# Patient Record
Sex: Male | Born: 1985 | Race: Black or African American | Hispanic: No | Marital: Single | Smoking: Never smoker
Health system: Southern US, Community
[De-identification: ages and names within clinical notes are randomized; demographics above are authoritative.]

## PROBLEM LIST (undated history)

## (undated) HISTORY — PX: CRANIOTOMY: SHX93

---

## 2020-05-18 ENCOUNTER — Emergency Department (HOSPITAL_COMMUNITY): Payer: Self-pay

## 2020-05-18 ENCOUNTER — Encounter (HOSPITAL_COMMUNITY): Payer: Self-pay | Admitting: *Deleted

## 2020-05-18 ENCOUNTER — Other Ambulatory Visit: Payer: Self-pay

## 2020-05-18 ENCOUNTER — Emergency Department (HOSPITAL_COMMUNITY)
Admission: EM | Admit: 2020-05-18 | Discharge: 2020-05-18 | Disposition: A | Discharge status: Home / Self Care | Payer: Self-pay | Attending: Emergency Medicine | Admitting: Emergency Medicine

## 2020-05-18 DIAGNOSIS — R0789 Other chest pain: Secondary | ICD-10-CM

## 2020-05-18 DIAGNOSIS — Z20822 Contact with and (suspected) exposure to covid-19: Secondary | ICD-10-CM | POA: Insufficient documentation

## 2020-05-18 DIAGNOSIS — F1490 Cocaine use, unspecified, uncomplicated: Secondary | ICD-10-CM

## 2020-05-18 DIAGNOSIS — R443 Hallucinations, unspecified: Secondary | ICD-10-CM

## 2020-05-18 LAB — COMPREHENSIVE METABOLIC PANEL
ALT: 82 U/L — ABNORMAL HIGH (ref 0–44)
AST: 41 U/L (ref 15–41)
Albumin: 4 g/dL (ref 3.5–5.0)
Alkaline Phosphatase: 63 U/L (ref 38–126)
Anion gap: 8 (ref 5–15)
BUN: 10 mg/dL (ref 6–20)
CO2: 29 mmol/L (ref 22–32)
Calcium: 9.1 mg/dL (ref 8.9–10.3)
Chloride: 104 mmol/L (ref 98–111)
Creatinine, Ser: 0.88 mg/dL (ref 0.61–1.24)
GFR, Estimated: >60 mL/min (ref >60)
Glucose, Bld: 86 mg/dL (ref 70–99)
Potassium: 3.9 mmol/L (ref 3.5–5.1)
Sodium: 141 mmol/L (ref 135–145)
Total Bilirubin: 0.4 mg/dL (ref 0.3–1.2)
Total Protein: 7.1 g/dL (ref 6.5–8.1)

## 2020-05-18 LAB — RAPID URINE DRUG SCREEN, HOSP PERFORMED
Amphetamines: NOT DETECTED
Barbiturates: NOT DETECTED
Benzodiazepines: NOT DETECTED
Cocaine: POSITIVE — AB
Opiates: NOT DETECTED
Tetrahydrocannabinol: POSITIVE — AB

## 2020-05-18 LAB — CBC
HCT: 38.9 % — ABNORMAL LOW (ref 39.0–52.0)
Hemoglobin: 12.6 g/dL — ABNORMAL LOW (ref 13.0–17.0)
MCH: 28.6 pg (ref 26.0–34.0)
MCHC: 32.4 g/dL (ref 30.0–36.0)
MCV: 88.4 fL (ref 80.0–100.0)
Platelets: 237 10*3/uL (ref 150–400)
RBC: 4.4 MIL/uL (ref 4.22–5.81)
RDW: 14.6 % (ref 11.5–15.5)
WBC: 6.6 10*3/uL (ref 4.0–10.5)
nRBC: 0 % (ref 0.0–0.2)

## 2020-05-18 LAB — RESP PANEL BY RT-PCR (FLU A&B, COVID) ARPGX2
Influenza A by PCR: NEGATIVE
Influenza B by PCR: NEGATIVE
SARS Coronavirus 2 by RT PCR: NEGATIVE

## 2020-05-18 LAB — ETHANOL: Alcohol, Ethyl (B): <10 mg/dL (ref <10)

## 2020-05-18 LAB — TROPONIN I (HIGH SENSITIVITY)
Troponin I (High Sensitivity): 4 ng/L (ref <18)
Troponin I (High Sensitivity): 3 ng/L (ref <18)

## 2020-05-18 LAB — SALICYLATE LEVEL: Salicylate Lvl: <7 mg/dL — ABNORMAL LOW (ref 7.0–30.0)

## 2020-05-18 LAB — ACETAMINOPHEN LEVEL: Acetaminophen (Tylenol), Serum: <10 ug/mL — ABNORMAL LOW (ref 10–30)

## 2020-05-18 NOTE — ED Provider Notes (Signed)
Travis Ellison   CSN: [id number] Arrival date & time: 05/18/20  0029     History Chief Complaint  Patient presents with  . Chest Pain  . Hallucinations    Travis Ellison is a 35 y.o. male who reports no past medical history, no old records available for review who presents today for evaluation of 2 complaints. He states that yesterday he started hearing voices when he was at work.  He feels like there are people out to get him.  He denies visual hallucinations.  He states that the voices are telling him to kill himself.  He denies homicidal ideation.  He states that he has no psychiatric history.  He states he has previously been seen at outside hospital and [place] however cannot tell me what for or where it was so I am unable to review any outside records.    He reports that he was walking home from his uncle's house tonight when he started having a sharp pain in the middle of his chest with shortness of breath.  It does not radiate or move.  He denies any history of similar.  He denies any possible causative events for his hallucinations, feeling like people are out to get him or his chest pain.  He reports that he has 1 or 2 drinks a couple times a week.  He denies any drug use specifically including marijuana or cocaine.  He denies alcohol use,. He denies any recent sickness.  He states he is vaccinated against COVID.  He denies having prior COVID infection.  He states that he has no prior medical or psychiatric diagnoses.  When I ask him about the scar on his head he says "that was a long time ago, I fell."  HPI     History reviewed. No pertinent past medical history.  There are no problems to display for this patient.   History reviewed. No pertinent surgical history.     No family history on file.     Home Medications Prior to Admission medications   Not on File    Allergies    Patient has no known allergies.  Review  of Systems   Review of Systems  Constitutional: Negative for chills and fever.  HENT: Negative for congestion.   Eyes: Negative for visual disturbance.  Respiratory: Positive for shortness of breath.   Cardiovascular: Positive for chest pain. Negative for palpitations and leg swelling.  Gastrointestinal: Negative for abdominal pain, diarrhea, nausea and vomiting.  Musculoskeletal: Negative for back pain.  Skin: Negative for color change and wound.  Neurological: Negative for weakness and headaches.  Psychiatric/Behavioral: Positive for behavioral problems and hallucinations.  All other systems reviewed and are negative.   Physical Exam Updated Vital Signs BP 100/65 (BP Location: Right Arm)   Pulse 72   Temp 98.3 F (36.8 C) (Oral)   Resp 18   SpO2 97%   Physical Exam Vitals and nursing Ellison reviewed.  Constitutional:      General: He is not in acute distress.    Appearance: He is not diaphoretic.  HENT:     Head: Normocephalic and atraumatic.     Comments: Large well healed scar across top of head Eyes:     General: No scleral icterus.       Right eye: No discharge.        Left eye: No discharge.     Conjunctiva/sclera: Conjunctivae normal.  Cardiovascular:     Rate  and Rhythm: Normal rate and regular rhythm.     Heart sounds: Normal heart sounds. No murmur heard.   Pulmonary:     Effort: Pulmonary effort is normal. No accessory muscle usage or respiratory distress.     Breath sounds: Normal breath sounds. No stridor. No decreased breath sounds.  Chest:     Chest wall: Tenderness (Palpation over anterior chest recreates and exacerbates his reported pain. ) present.  Abdominal:     General: There is no distension.  Musculoskeletal:        General: No deformity.     Cervical back: Normal range of motion and neck supple.     Right lower leg: No tenderness. No edema.     Left lower leg: No tenderness. No edema.  Skin:    General: Skin is warm and dry.   Neurological:     Mental Status: He is alert.     Motor: No abnormal muscle tone.  Psychiatric:        Behavior: Behavior normal.     ED Results / Procedures / Treatments   Labs (all labs ordered are listed, but only abnormal results are displayed) Labs Reviewed  COMPREHENSIVE METABOLIC PANEL - Abnormal; Notable for the following components:      Result Value   ALT 82 (*)    All other components within normal limits  CBC - Abnormal; Notable for the following components:   Hemoglobin 12.6 (*)    HCT 38.9 (*)    All other components within normal limits  RAPID URINE DRUG SCREEN, HOSP PERFORMED - Abnormal; Notable for the following components:   Cocaine POSITIVE (*)    Tetrahydrocannabinol POSITIVE (*)    All other components within normal limits  ACETAMINOPHEN LEVEL - Abnormal; Notable for the following components:   Acetaminophen (Tylenol), Serum <10 (*)    All other components within normal limits  SALICYLATE LEVEL - Abnormal; Notable for the following components:   Salicylate Lvl <7.0 (*)    All other components within normal limits  RESP PANEL BY RT-PCR (FLU A&B, COVID) ARPGX2  ETHANOL  TROPONIN I (HIGH SENSITIVITY)  TROPONIN I (HIGH SENSITIVITY)    EKG None  Radiology DG Chest 2 View  Result Date: 05/18/2020 CLINICAL DATA:  Chest pain, shortness of breath EXAM: CHEST - 2 VIEW COMPARISON:  None. FINDINGS: Fine reticular opacities are present throughout both lungs. Pulmonary vascularity remains normally distributed. No pneumothorax or visible effusion. The cardiomediastinal contours are unremarkable. No acute osseous or soft tissue abnormality. IMPRESSION: Fine reticular opacities throughout both lungs, could reflect mild edema or infection/inflammation the appropriate setting. Electronically Signed   By: Kreg Shropshire M.D.   On: 05/18/2020 01:07   CT Head Wo Contrast  Result Date: 05/18/2020 CLINICAL DATA:  Mental status change with unknown cause. EXAM: CT HEAD  WITHOUT CONTRAST TECHNIQUE: Contiguous axial images were obtained from the base of the skull through the vertex without intravenous contrast. COMPARISON:  None. FINDINGS: Brain: No evidence of acute infarction, hemorrhage, hydrocephalus, extra-axial collection or mass lesion/mass effect. Vascular: No hyperdense vessel or unexpected calcification. Skull: Remote high right craniotomy for uncertain indication. Sinuses/Orbits: Mild patchy opacification of frontal and ethmoid sinuses, incidental to the history. IMPRESSION: Negative head CT. Electronically Signed   By: Marnee Spring M.D.   On: 05/18/2020 06:03    Procedures Procedures (including critical care time)  Medications Ordered in ED Medications - No data to display  ED Course  I have reviewed the triage vital signs and  the nursing notes.  Pertinent labs & imaging results that were available during my care of the patient were reviewed by me and considered in my medical decision making (see chart for details).    MDM Rules/Calculators/A&P                          Patient is a 35 year old man who reports that since yesterday he has been hearing voices.  He feels like "everyone is out to get me."  He states that the voices tell him to harm himself. He denies drug use, UDS positive for cocaine and THC. His affect is flat and withdrawn.  Chest x-ray shows fine reticular opacities through both lungs which may be infectious or inflammatory. Here patient is afebrile not tachycardic or tachypneic, 97% on room air. His chest pain is recreated with palpation over the anterior chest. EKG without evidence of ischemia. Doubt ACS. Patient denies any drug use however his UDS is positive for both cocaine and THC. Suspect musculoskeletal chest pain. Opponent x2 is not elevated, doubt ACS. COVID test is ordered.  Given that patient reports these hallucinations are new suddenly started yesterday that he has never had before addition of CT head was obtained  which shows prior craniotomy however no acute cause of patient's symptoms.  On my exam patient is very withdrawn, he pulls the blanket over his head, only takes it off if directly asked to do so than for short period time. During that time he does not make eye contact. He answers questions with 1-2 words whenever possible. He reports that the voices that he is hearing tell him to hurt himself and reports feelings of everyone being out to get him however denies specific instigating event. He states he does not have a history of psychiatric or medical diagnoses and is not on any medicine for this at baseline.  Unable to view outside records at this time to obtain any prior records or corroborating information.  Acetaminophen and salicylate levels are undetected.  At this time COVID test is pending however otherwise patient is medically clear for psychiatric evaluation and disposition.  Ellison: Portions of this report may have been transcribed using voice recognition software. Every effort was made to ensure accuracy; however, inadvertent computerized transcription errors may be present   Final Clinical Impression(s) / ED Diagnoses Final diagnoses:  Hallucinations  Atypical chest pain  Cocaine use    Rx / DC Orders ED Discharge Orders    None       Cristina Gong, PA-C 05/18/20 2952    Geoffery Lyons, MD 05/18/20 2325

## 2020-05-18 NOTE — BH Assessment (Signed)
Comprehensive Clinical Assessment (CCA) Note  05/18/2020 Travis Ellison [id number]   Disposition: per Dr. Lucianne Muss pt is psych cleared   Pt is a 35 yo male who presents voluntarily to [facility] via [facility]. Pt was accompanied by EMS  reporting symptoms of hallucinations and shortness of breath Pt denies  history of mental health and substance use  was referred for assessment by ED . Pt denies medication. Pt denies current suicidal ideation with no plans of self harm . Denies past attempts. Pt denies symptoms of depression, including anhedonia, isolating, feelings of worthlessness & guilt, tearfulness, changes in sleep & appetite, & increased irritability. Pt denies homicidal ideation/ history of violence. Pt denies auditory & visual hallucinations or other symptoms of psychosis. Pt did advised that he was hearing voices yesterday of a voice stating" they were out to get him and his wife", but denies hearing voices currently. Pt advised Clinical research associate that "he hear the same voices every year for about an hour and they go away". Pt disclosed that "he thinks its from not sleeping enough". Pt states current stressors include wife and child.   Pt lives sister and supports include family. Pt denies a hx of abuse and trauma. Pt denies there is a family history of mental health or substance abuse . Pt's is unemployed. Pt has good insight and judgment. Pt's memory is intact . Pt did not report any legal history.  Protective factors against suicide include good family support, no current suicidal ideation, future orientation, therapeutic relationship, no access to firearms, no current psychotic symptoms and no prior attempts.  Pt's denies OP / IP history.  Pt denies alcohol/ substance abuse.  MSE: Pt is dressed in hospital scrubs, alert, oriented x5 with soft  speech and normal motor behavior. Eye contact is good. Pt's mood is euthymic and affect is normal . Affect is congruent with mood. Thought process is coherent and relevant.  There is no indication Pt is currently responding to internal stimuli or experiencing delusional thought content. Pt was cooperative throughout assessment.  Collateral: pt refused to provide collateral information for sister or wife during interview.   Disposition:per Dr. Lucianne Muss pt is psych cleared    Chief Complaint:  Chief Complaint  Patient presents with  . Chest Pain  . Hallucinations   Visit Diagnosis:    CCA Screening, Triage and Referral (STR)  Patient Reported Information How did you hear about [place]? Self  Referral name: No data recorded Referral phone number: No data recorded  Whom do you see for routine medical problems? I don't have a doctor  Practice/Facility Name: No data recorded Practice/Facility Phone Number: No data recorded Name of Contact: No data recorded Contact Number: No data recorded Contact Fax Number: No data recorded Prescriber Name: No data recorded Prescriber Address (if known): No data recorded  What Is the Reason for Your Visit/Call Today? chest pain/ hallucinations  How Long Has This Been Causing You Problems? <Week  What Do You Feel Would Help You the Most Today? Medication   Have You Recently Been in Any Inpatient Treatment (Hospital/Detox/Crisis Center/28-Day Program)? No  Name/Location of Program/Hospital:No data recorded How Long Were You There? No data recorded When Were You Discharged? No data recorded  Have You Ever Received Services From [facility] Before? No  Who Do You See at [facility]? No data recorded  Have You Recently Had Any Thoughts About Hurting Yourself? No  Are You Planning to Commit Suicide/Harm Yourself At This time? No   Have you Recently  Had Thoughts About Hurting Someone Karolee Ohslse? No  Explanation: No data recorded  Have You Used Any Alcohol or Drugs in the Past 24 Hours? No  How Long Ago Did You Use Drugs or Alcohol? No data recorded What Did You Use and How Much? No data recorded  Do You Currently  Have a Therapist/Psychiatrist? No  Name of Therapist/Psychiatrist: No data recorded  Have You Been Recently Discharged From Any Office Practice or Programs? No  Explanation of Discharge From Practice/Program: No data recorded    CCA Screening Triage Referral Assessment Type of Contact: Tele-Assessment  Is this Initial or Reassessment? Initial Assessment  Date Telepsych consult ordered in [facility]:  05/18/2020  [facility]   Patient Reported Information Reviewed? Yes  Patient Left Without Being Seen? No data recorded Reason for Not Completing Assessment: No data recorded  Collateral Involvement: No data recorded  Does Patient Have a Court Appointed Legal Guardian? No data recorded Name and Contact of Legal Guardian: No data recorded If Minor and Not Living with Parent(s), Who has Custody? No data recorded Is CPS involved or ever been involved? Never  Is APS involved or ever been involved? Never   Patient Determined To Be At Risk for Harm To Self or Others Based on Review of Patient Reported Information or Presenting Complaint? No  Method: No data recorded Availability of Means: No data recorded Intent: No data recorded Notification Required: No data recorded Additional Information for Danger to Others Potential: No data recorded Additional Comments for Danger to Others Potential: No data recorded Are There Guns or Other Weapons in Your Home? No data recorded Types of Guns/Weapons: No data recorded Are These Weapons Safely Secured?                            No data recorded Who Could Verify You Are Able To Have These Secured: No data recorded Do You Have any Outstanding Charges, Pending Court Dates, Parole/Probation? No data recorded Contacted To Inform of Risk of Harm To Self or Others: No data recorded  [facility]   Does Patient Present under Involuntary Commitment? No  IVC Papers Initial File Date: No data  recorded  [place]   Patient Currently Receiving the Following Services: No data recorded  Determination of Need: Routine (7 days)   Options For Referral: No data recorded    CCA Biopsychosocial Intake/Chief Complaint:  Hallucinations/ shortness of breath  Current Symptoms/Problems: shortness of breath   Patient Reported Schizophrenia/Schizoaffective Diagnosis in Past: No   Strengths: No data recorded Preferences: No data recorded Abilities: No data recorded  Type of Services Patient Feels are Needed: No data recorded  Initial Clinical Notes/Concerns: No data recorded  Mental Health Symptoms Depression:  None   Duration of Depressive symptoms: No data recorded  Mania:  None   Anxiety:   None   Psychosis:  None   Duration of Psychotic symptoms: No data recorded  Trauma:  None   Obsessions:  N/A   Compulsions:  N/A   Inattention:  N/A   Hyperactivity/Impulsivity:  N/A   Oppositional/Defiant Behaviors:  N/A   Emotional Irregularity:  N/A   Other Mood/Personality Symptoms:  No data recorded   Mental Status Exam Appearance and self-care  Stature:  Average   Weight:  Average weight   Clothing:  -- (In hospital scrubs)   Grooming:  Normal   Cosmetic use:  None   Posture/gait:  Other (Comment) (Patient was laying the the bed)   Motor activity:  Not Remarkable   Sensorium  Attention:  Normal   Concentration:  Preoccupied (patient was sleepy)   Orientation:  X5   Recall/memory:  Normal   Affect and Mood  Affect:  Appropriate   Mood:  Euthymic   Relating  Eye contact:  Normal   Facial expression:  Responsive   Attitude toward examiner:  Cooperative   Thought and Language  Speech flow: Clear and Coherent; Soft   Thought content:  Appropriate to Mood and Circumstances   Preoccupation:  None   Hallucinations:  None   Organization:  No data recorded  [employer] of Knowledge:  Good    Intelligence:  Average   Abstraction:  Normal   Judgement:  Good   Reality Testing:  Realistic   Insight:  Good   Decision Making:  Normal   Social Functioning  Social Maturity:  Responsible   Social Judgement:  Normal   Stress  Stressors:  Family conflict; Financial   Coping Ability:  Normal   Skill Deficits:  None   Supports:  Family     Religion: Religion/Spirituality Are You A Religious Person?: No  Leisure/Recreation: Leisure / Recreation Do You Have Hobbies?: No  Exercise/Diet: Exercise/Diet Do You Exercise?: No Have You Gained or Lost A Significant Amount of Weight in the Past Six Months?: No Do You Follow a Special Diet?: No Do You Have Any Trouble Sleeping?: Yes Explanation of Sleeping Difficulties: past few nights only getting 6 hours sleep   CCA Employment/Education Employment/Work Situation: Employment / Work Psychologist, occupational Employment situation: Unemployed Patient's job has been impacted by current illness: No Has patient ever been in the [employer]?: No  Education: Education Is Patient Currently Attending School?: No Did Garment/textile technologist From [employer]?: No Did You Product manager?: No Did Designer, television/film set?: No Did You Have An Individualized Education Program (IIEP): No Did You Have Any Difficulty At [employer]?: No Patient's Education Has Been Impacted by Current Illness: No   CCA Family/Childhood History Family and Relationship History: Family history Does patient have children?: Yes How many children?: 1 How is patient's relationship with their children?: good  Childhood History:  Childhood History By whom was/is the patient raised?: Other (Comment) (Unknown) Does patient have siblings?: Yes Number of Siblings: 1 Description of patient's current relationship with siblings: good Did patient suffer any verbal/emotional/physical/sexual abuse as a child?: No Did patient suffer from severe childhood neglect?: No Has patient ever  been sexually abused/assaulted/raped as an adolescent or adult?: No Was the patient ever a victim of a crime or a disaster?: No Witnessed domestic violence?: No Has patient been affected by domestic violence as an adult?: No  Child/Adolescent Assessment:     CCA Substance Use Alcohol/Drug Use: Alcohol / Drug Use Pain Medications: SEE MAR Prescriptions: SEE MAR Over the Counter: SEE MAR History of alcohol / drug use?: No history of alcohol / drug abuse                         ASAM's:  Six Dimensions of Multidimensional Assessment  Dimension 1:  Acute Intoxication and/or Withdrawal Potential:      Dimension 2:  Biomedical Conditions and Complications:      Dimension 3:  Emotional, Behavioral, or Cognitive Conditions and Complications:     Dimension 4:  Readiness to Change:     Dimension 5:  Relapse, Continued use, or Continued Problem Potential:     Dimension 6:  Recovery/Living Environment:     ASAM Severity Score:    ASAM Recommended Level of Treatment:     Substance use Disorder (SUD)    Recommendations for Services/Supports/Treatments:    DSM5 Diagnoses: There are no problems to display for this patient.   Patient Centered Plan: Patient is on the following Treatment Plan(s):    Referrals to Alternative Service(s): Referred to Alternative Service(s):   Place:   Date:   Time:    Referred to Alternative Service(s):   Place:   Date:   Time:    Referred to Alternative Service(s):   Place:   Date:   Time:    Referred to Alternative Service(s):   Place:   Date:   Time:     Rachel Moulds, [place]

## 2020-05-18 NOTE — ED Notes (Signed)
Lunch Tray Ordered @ 1019. 

## 2020-05-18 NOTE — ED Notes (Signed)
3 patient belonging bags in locker 1 in purple zone

## 2020-05-18 NOTE — ED Triage Notes (Signed)
Pt arrives via [facility] from home with c/o Cp and sob sharp/central around 2300 tonight. Refused asa. 12 lead normal. 130/70, hr 60, rr 16,  100% RA.

## 2020-05-18 NOTE — ED Provider Notes (Signed)
Emergency Medicine Observation Re-evaluation Note  Travis Ellison is a 35 y.o. male, seen on rounds today.  Pt initially presented to the ED for complaints of Chest Pain and Hallucinations Currently, the patient is resting calmly in his hospital bed.  He is pleasant and conversive.  Currently has no complaints.  Physical Exam  BP 100/65 (BP Location: Right Arm)   Pulse 72   Temp 98.3 F (36.8 C) (Oral)   Resp 18   SpO2 97%  Physical Exam General: Well-appearing Cardiac: Normal rate Lungs: In no acute distress, no increased work of breathing. Psych: Does not appear to be interacting with internal stimuli, pleasant mood.  Denies SI/HI at this time.  ED Course / MDM  EKG:    I have reviewed the labs performed to date as well as medications administered while in observation.  Recent changes in the last 24 hours include none.  Plan  Current plan is for discharge at this time, as patient has been formally psych cleared.  Will provide outpatient psychiatric resources. Patient is not under full IVC at this time.   Sherrilee Gilles 05/18/20 1303    Blane Ohara, MD 05/18/20 1615

## 2020-05-18 NOTE — ED Triage Notes (Signed)
Pt states he was walking home from his uncles house tonight and started having a sharp pain in the middle of his chest, also having some shortness of breath. In the middle of asking questions about chest pain, pt says "I am hearing voices". He says that the voices tell him people are "out for me" and the voices are telling him to hurt himself. He denies SI. Denies that he is taking medications for the same. Alert/oriented/cooperative.

## 2020-05-18 NOTE — Discharge Instructions (Addendum)
You may follow up for your mental health needs with a provider of your choosing from the list below.   Substance Abuse Treatment Programs  Intensive Outpatient Programs [facility]     [address]      [place], [place]                   [phone]       [facility] [address] [place] [address] [place], [place] [phone]  [facility]     (Inpatient and outpatient)     [address]           [phone]    [facility] [phone] (Suboxone and Methadone)  [address]      [place], [place] [address]      [phone]       [address] [place], [place] [phone]  [facility] (Outpatient/Inpatient, Chemical)    (insurance only) [phone]             [facility] (Groups & Residential) [place], [place] [phone]     [facility]     [address]     [place], [place]      [phone]       [facility] (for caregivers and family) [phone] [address] Laurell [address] [place], [place] [phone]      Residential Treatment Programs [facility]      [address] [place], [place] [address]  [phone]       [facility] [address], [place], [place] [address] [phone]  [facility] [place]        [phone]       [facility] [phone]  [facility] ([facility]             [address]                                         [place], [place]                                                [phone] or [phone]                               [facility] [place] [address] [place], [address] [phone]  [facility]    [address]      [place], [place]     [phone]       [facility] [address] [place], [place] [phone]  [facility]   [address] [place], [place]  [address]     [phone]      Admissions: 8am-3pm M-F  [facility] ([facility]) [address] [place], [place] [phone]  [facility]: Residential Program [phone] Days)   [place], [place]      [phone] or [phone]     [facility] [place], [place] (Walk in Hours over the weekend or by referral)  [facility] [address] [place], [place], [place] [address] [phone]  Crisis Mobile: [facility]:  [phone] (for crisis response 24  hours a day) [facility] Hotline:      ([phone] Outpatient Psychiatry and Counseling  [facility]: Mobile Crisis Management 24 hours:  [phone]  [facility] of the [employer] sliding scale fee and walk in schedule: M-F 8am-12pm/1pm-3pm [address]  [place], [place] [address] [phone]  [facility] [address] [place], [place] [address] [phone]  [facility] ([facility] [employer])- new patient walk-in appointments available Monday - Friday 8am -3pm.          [address] [place], [place] [address] [phone] or crisis line- [phone]  [facility]/ Intensive Outpatient Therapy Program [address] [place], [place] [address] [phone]  [facility]                  Crisis Services      [phone]     [place], [place] [address]                 [facility]   [facility] [phone]. [address] [place], [place] [address]   [employer] of Care          [address] Bea Laura  [place], [place] [address]       [phone]  [facility] [address] [place], [place] [address] [phone]  [facility]    [address]    [place], [place] [address]     [phone]       Andee Poles,  MD     [address]     [place] [address]     [phone]       [facility] [address] [place] [address]  [facility]     [address] [place], [place]      [phone]       [facility] Eulogio Ditch, MD [address] [place], [place] [address] [phone]  [facility]     [address]     [place], [place] [address]     [phone]       [facility] [address] [place], [place] [address] [phone] Resources for Temporary Residential Assistance/Crisis Centers  [facility]) M-F 8am-3pm   [address] [place], [place] [address]   [phone] Services include: laundry, barbering, support groups, case management, phone  & computer access, showers, AA/NA mtgs, mental health/substance abuse nurse, job skills class, disability information, VA assistance, spiritual classes, etc.   HOMELESS SHELTERS  [facility]     [facility]   [address] [place]     [phone]              [employer] (women and children)       [address] [place], [place] [address] [phone] [email or web address] for application and process Application Required  [facility]   [address]    [place] [address]     [phone]                    [facility] [place] [address] [place], [place] [address] [phone] application appt.) Application Required  [employer] (women only)  [place], [place] [address]     [phone]      Intake starts 6pm daily Need valid ID, SSC, & Police report [facility] [address] [place], [place] [phone] Application Required  [employer] (men only)     [place]      [place],  [place]       [place] (Pregnant women only) [address] [place], [place]  [facility]      [place] [address]      [place], [place] [address]     [phone]             [facility] [address] [place], [place] 90 day commitment/SA/Application process  [facility](men only)     [address]     [place], [place]       Check-in at [facility] [address] [place],  [address] [phone] Men/Women/Women and Children must be there by 7 pm  [place], [place]

## 2020-05-18 NOTE — ED Notes (Signed)
Spoke with lab and per lab they stated they can add it

## 2020-05-18 NOTE — ED Notes (Signed)
TTS done this AM

## 2021-11-07 IMAGING — DX DG CHEST 2V
2 series · 2 of 2 positions shown · non-contrast
Comparison: None.

CLINICAL DATA: Chest pain, shortness of breath

EXAM:
CHEST - 2 VIEW

[chest pa]
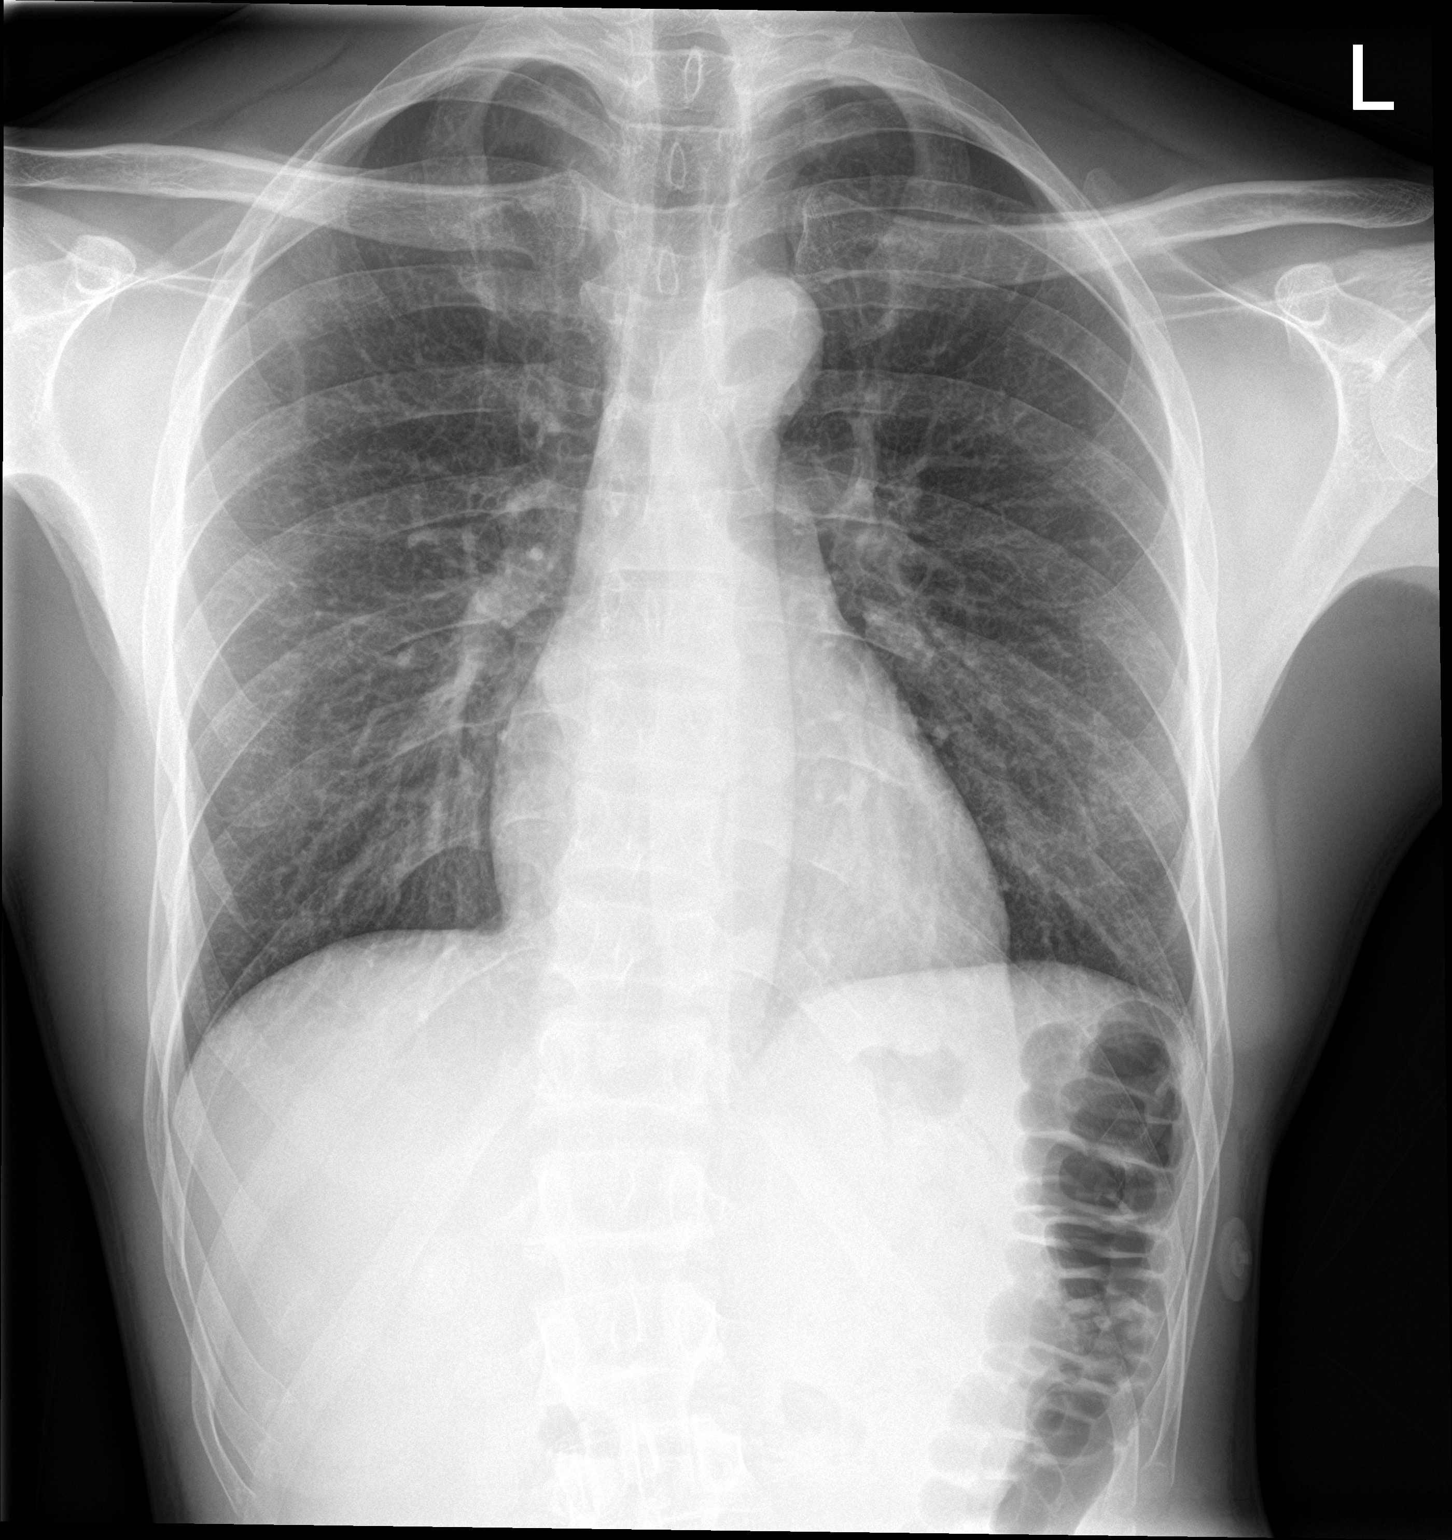

[chest lat]
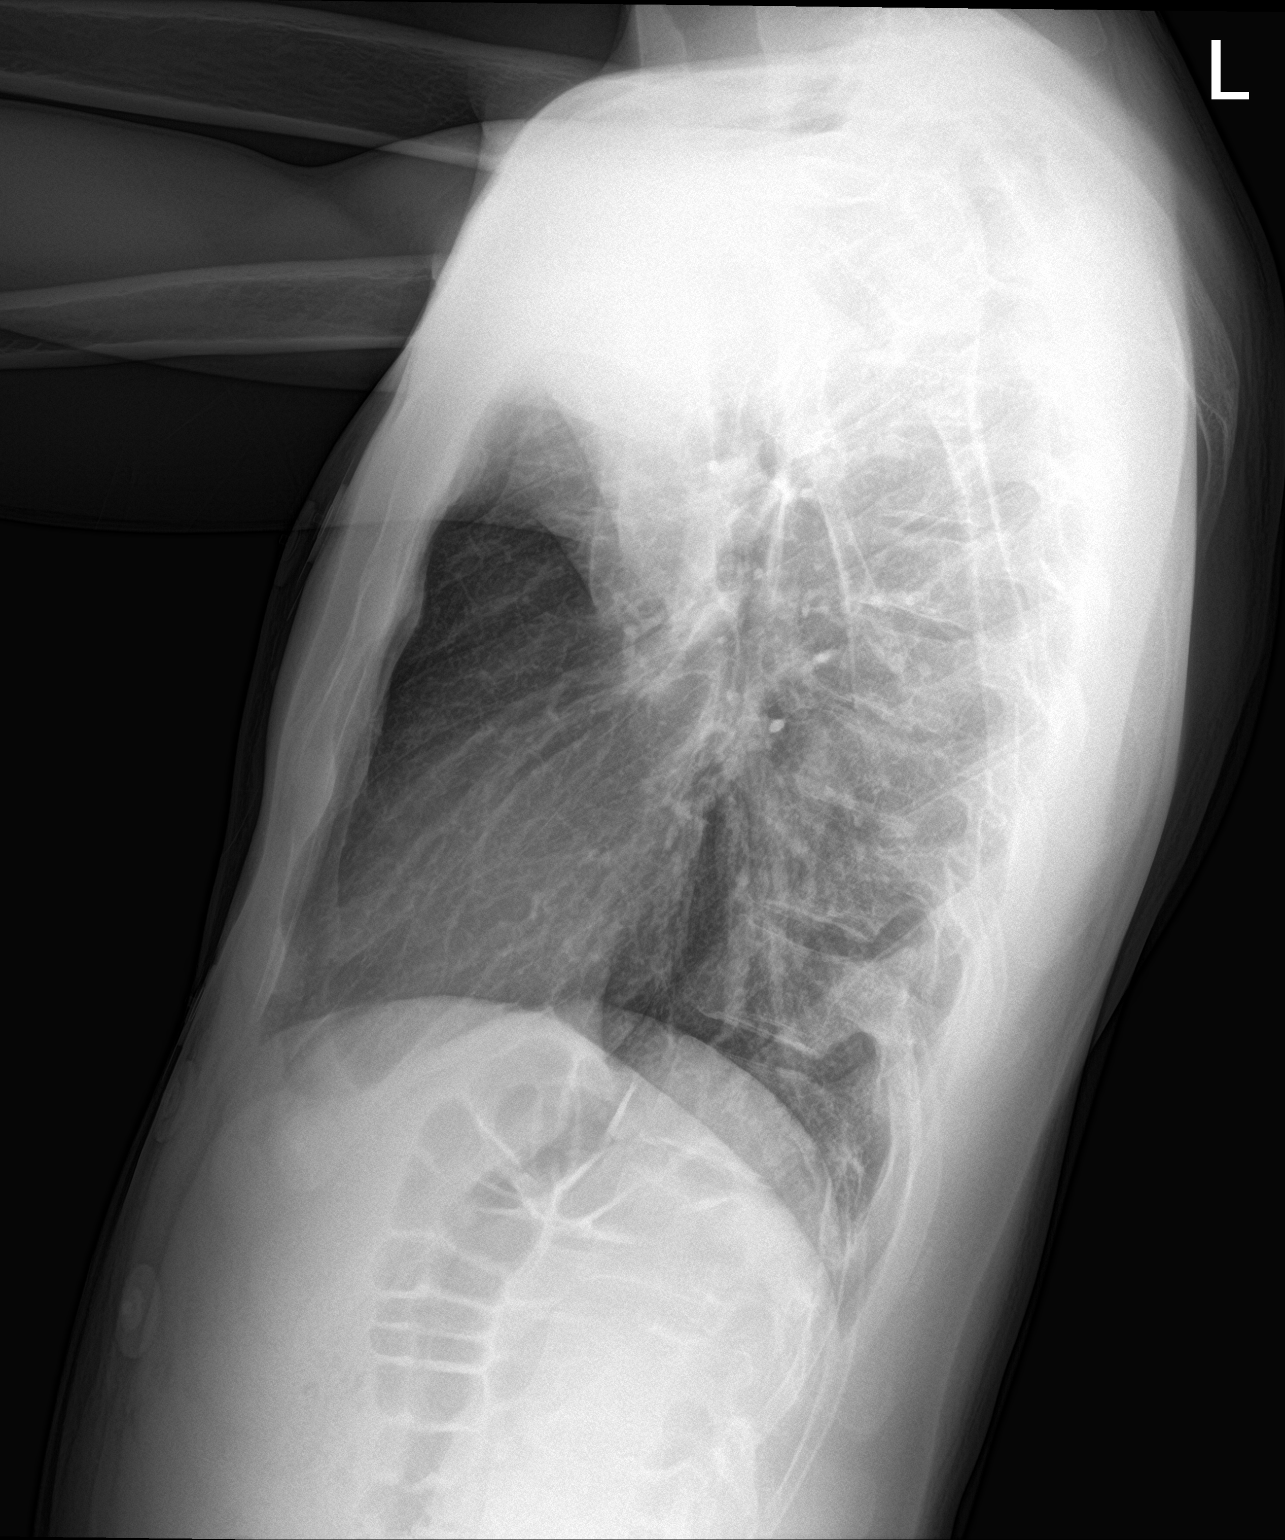

[2 of 2 positions shown; findings below may reference images not displayed]

FINDINGS: Fine reticular opacities are present throughout both lungs.
Pulmonary vascularity remains normally distributed. No pneumothorax
or visible effusion. The cardiomediastinal contours are
unremarkable. No acute osseous or soft tissue abnormality.
IMPRESSION: Fine reticular opacities throughout both lungs, could reflect mild
edema or infection/inflammation the appropriate setting.

## 2021-11-07 IMAGING — CT CT HEAD W/O CM
4 series · 17 of 47 positions shown, 19 images · non-contrast
Comparison: None.

CLINICAL DATA: Mental status change with unknown cause.

EXAM:
CT HEAD WITHOUT CONTRAST
TECHNIQUE: Contiguous axial images were obtained from the base of the skull
through the vertex without intravenous contrast.

[Series 3: head wo · axial · 0.43mm/px · z∈[-196,-70]mm · 7 of 35 slices shown, 9 images]
[im 5/35  brain]
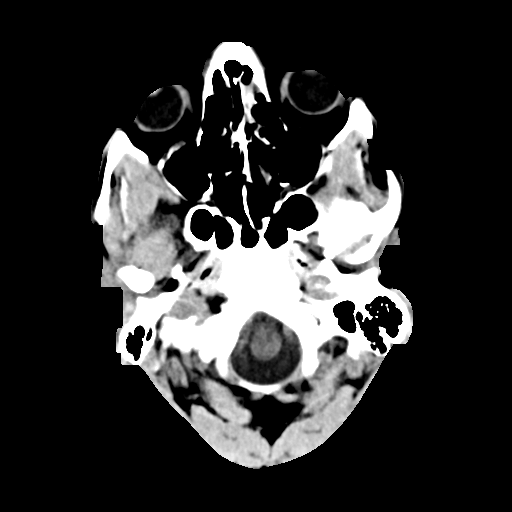
[im 5/35  bone]
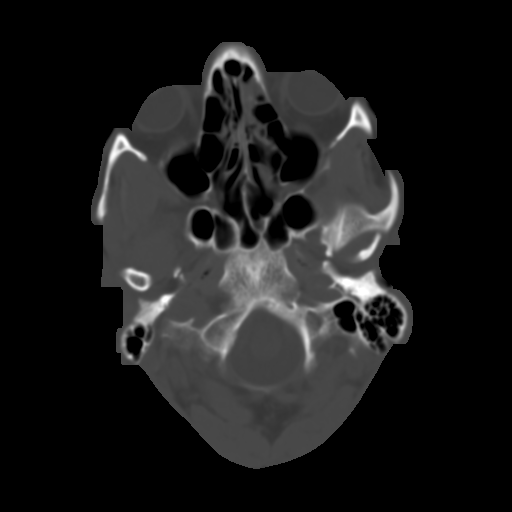
[im 9/35  brain]
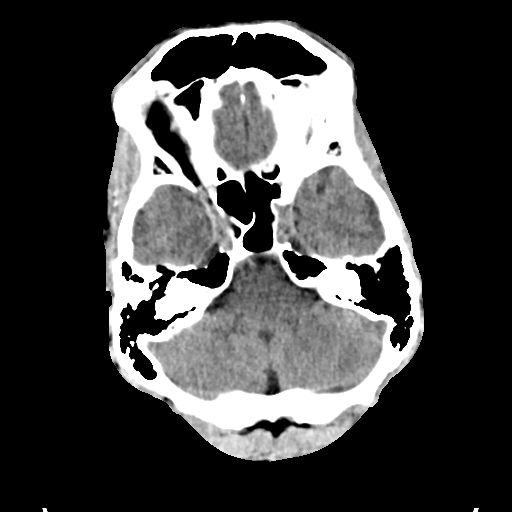
[im 13/35  brain]
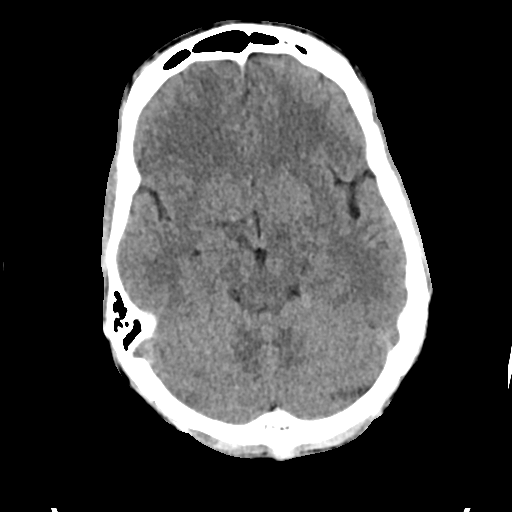
[im 18/35  brain]
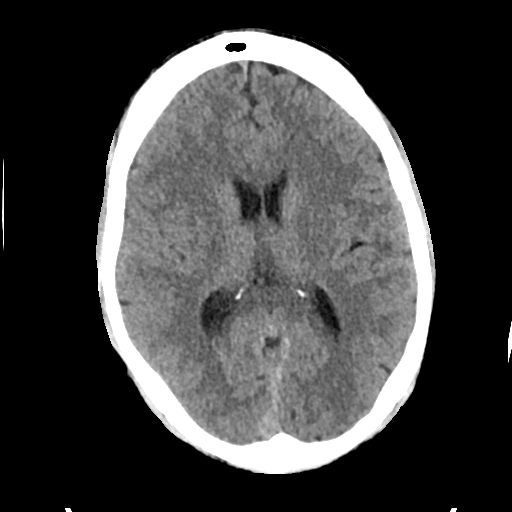
[im 22/35  brain]
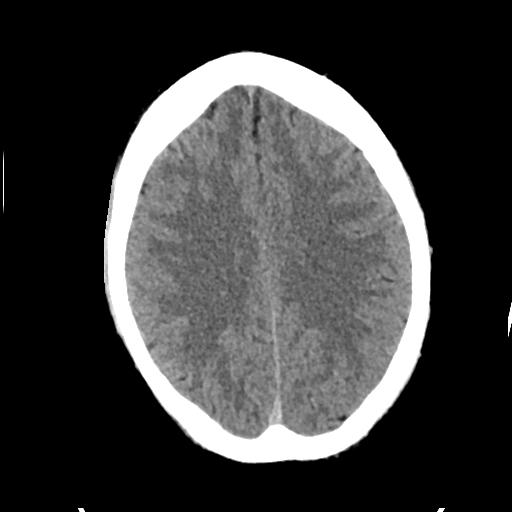
[im 22/35  bone]
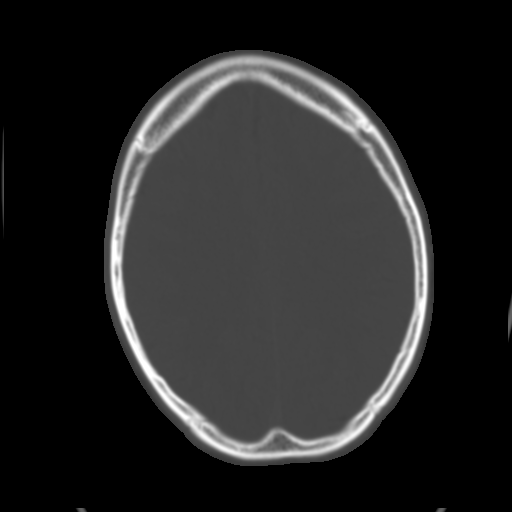
[im 26/35  brain]
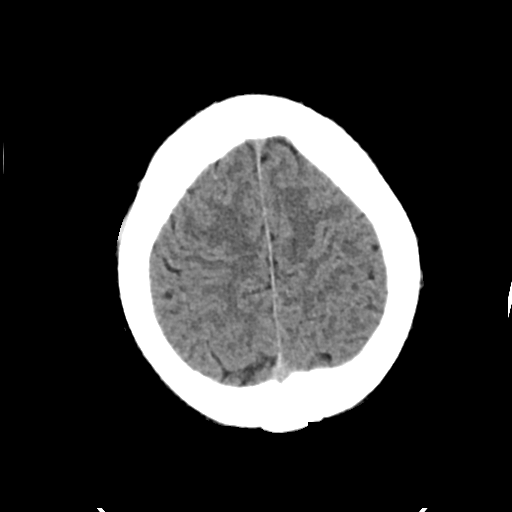
[im 30/35  brain]
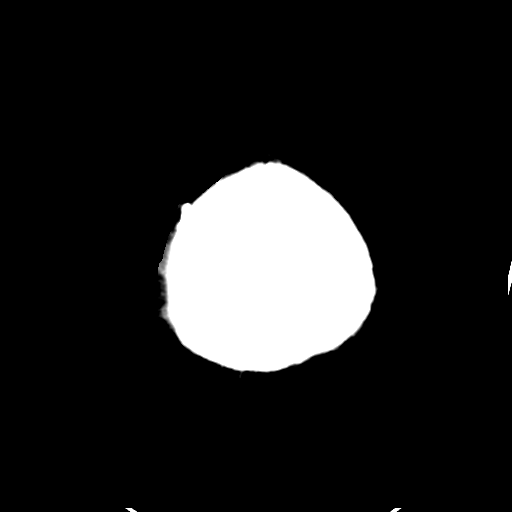

[Series 4: cor soft · coronal · 0.35mm/px · 3 of 70 slices shown]
[im 24/70  brain]
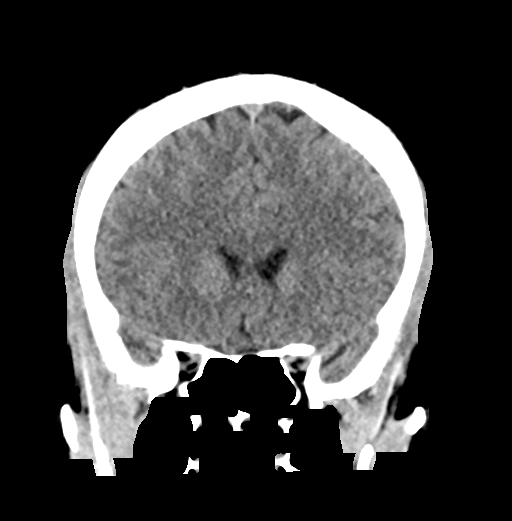
[im 31/70  brain]
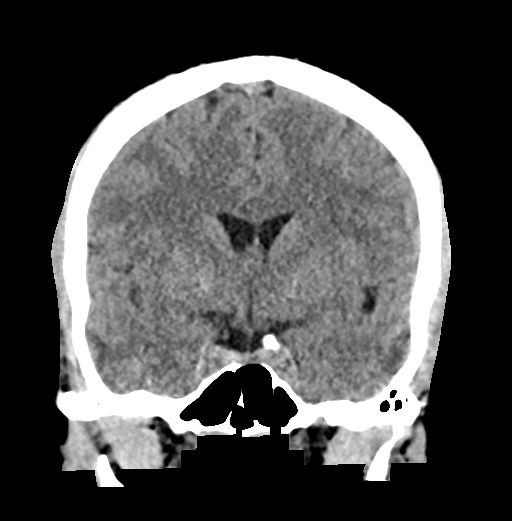
[im 39/70  brain]
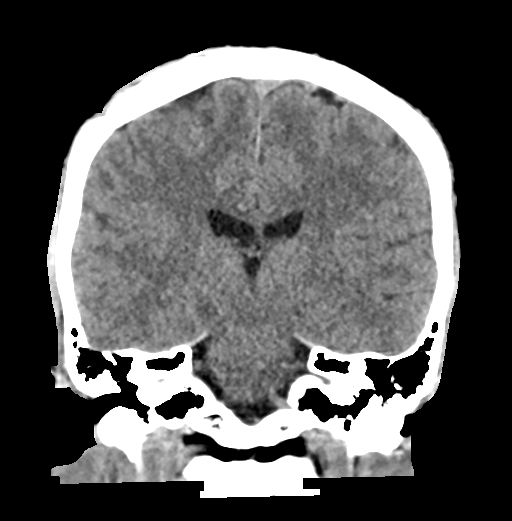

[Series 5: sag soft · sagittal · 0.36mm/px · 3 of 60 slices shown]
[im 20/60  brain]
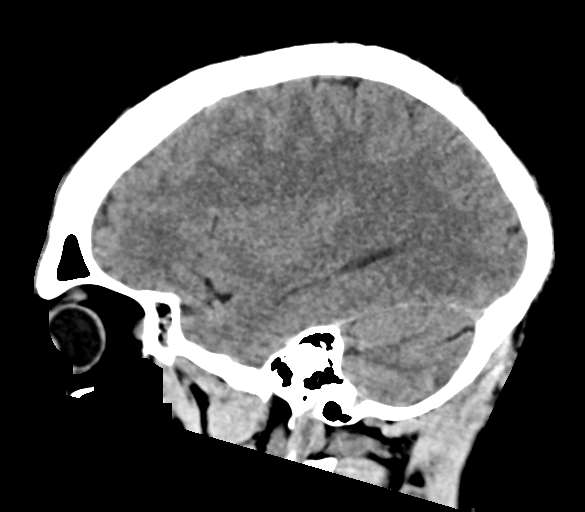
[im 30/60  brain]
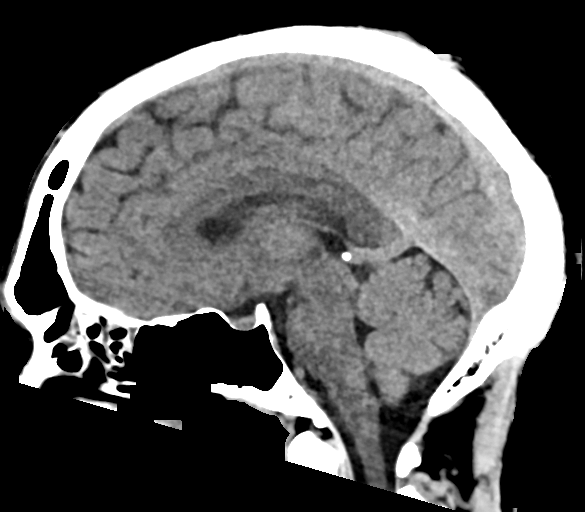
[im 40/60  brain]
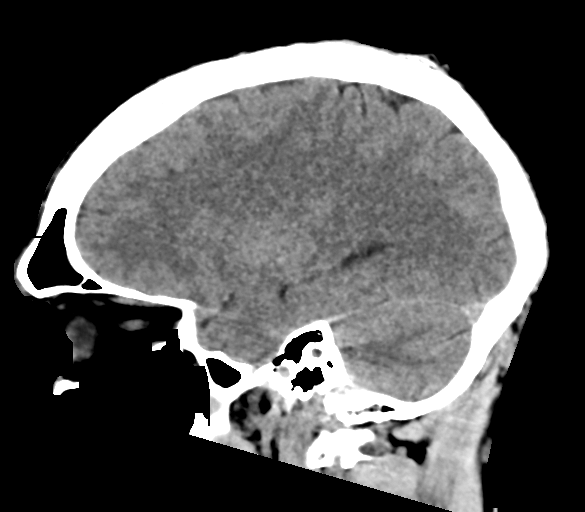

[Series 6: head bone · axial · 0.43mm/px · z∈[-200,-140]mm · 4 of 87 slices shown]
[im 9/87  bone]
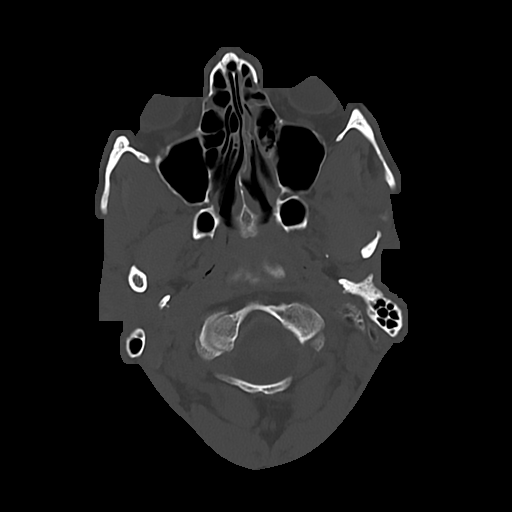
[im 18/87  bone]
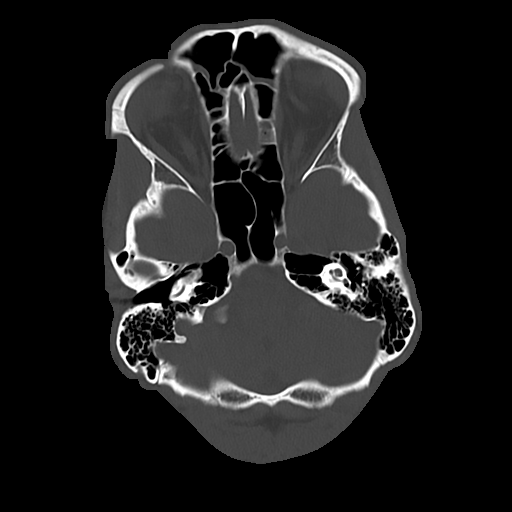
[im 26/87  bone]
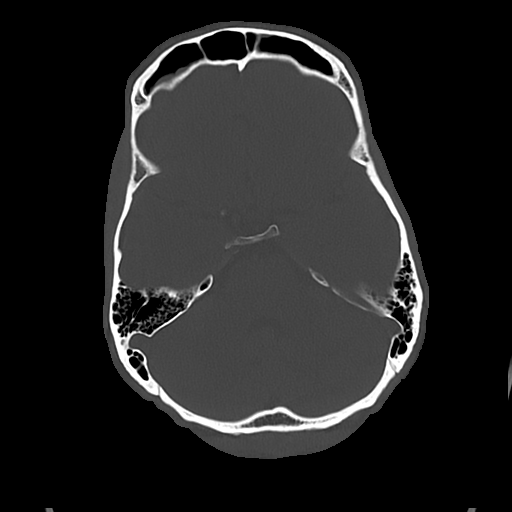
[im 39/87  bone]
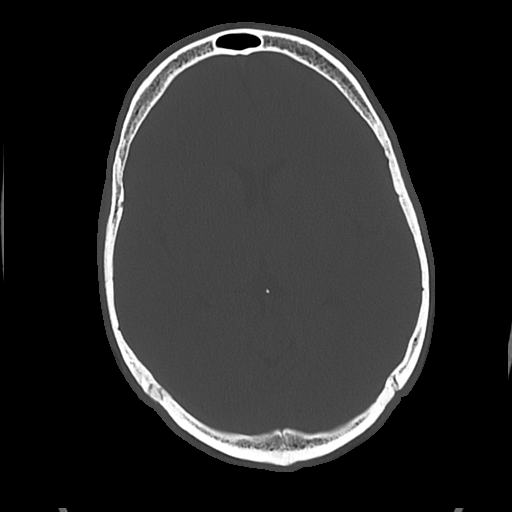

[17 of 47 positions shown; findings below may reference images not displayed]

FINDINGS: Brain: No evidence of acute infarction, hemorrhage, hydrocephalus,
extra-axial collection or mass lesion/mass effect.

Vascular: No hyperdense vessel or unexpected calcification.

Skull: Remote high right craniotomy for uncertain indication.

Sinuses/Orbits: Mild patchy opacification of frontal and ethmoid
sinuses, incidental to the history.
IMPRESSION: Negative head CT.
